# Patient Record
Sex: Female | Born: 1970 | Race: White | Hispanic: No | Smoking: Never smoker
Health system: Southern US, Community
[De-identification: ages and names within clinical notes are randomized; demographics above are authoritative.]

## PROBLEM LIST (undated history)

## (undated) DIAGNOSIS — I10 Essential (primary) hypertension: Secondary | ICD-10-CM

## (undated) DIAGNOSIS — E669 Obesity, unspecified: Secondary | ICD-10-CM

## (undated) HISTORY — PX: CARPAL TUNNEL RELEASE: SHX101

---

## 2014-04-01 ENCOUNTER — Emergency Department (HOSPITAL_COMMUNITY): Payer: Medicaid - Out of State

## 2014-04-01 ENCOUNTER — Emergency Department (HOSPITAL_COMMUNITY)
Admission: EM | Admit: 2014-04-01 | Discharge: 2014-04-01 | Disposition: A | Discharge status: Home / Self Care | Payer: Medicaid - Out of State | Attending: Emergency Medicine | Admitting: Emergency Medicine

## 2014-04-01 DIAGNOSIS — L0211 Cutaneous abscess of neck: Secondary | ICD-10-CM | POA: Diagnosis not present

## 2014-04-01 DIAGNOSIS — L0390 Cellulitis, unspecified: Secondary | ICD-10-CM

## 2014-04-01 DIAGNOSIS — L089 Local infection of the skin and subcutaneous tissue, unspecified: Secondary | ICD-10-CM | POA: Insufficient documentation

## 2014-04-01 DIAGNOSIS — L03221 Cellulitis of neck: Secondary | ICD-10-CM | POA: Diagnosis not present

## 2014-04-01 DIAGNOSIS — L02219 Cutaneous abscess of trunk, unspecified: Secondary | ICD-10-CM | POA: Diagnosis not present

## 2014-04-01 DIAGNOSIS — L03319 Cellulitis of trunk, unspecified: Secondary | ICD-10-CM

## 2014-04-01 DIAGNOSIS — L0291 Cutaneous abscess, unspecified: Secondary | ICD-10-CM

## 2014-04-01 LAB — BASIC METABOLIC PANEL
Anion gap: 11 (ref 5–15)
BUN: 12 mg/dL (ref 6–23)
CO2: 25 meq/L (ref 19–32)
Calcium: 8.8 mg/dL (ref 8.4–10.5)
Chloride: 101 mEq/L (ref 96–112)
Creatinine, Ser: 0.75 mg/dL (ref 0.50–1.10)
GFR calc Af Amer: >90 mL/min (ref >90)
GLUCOSE: 109 mg/dL — AB (ref 70–99)
Potassium: 3.8 mEq/L (ref 3.7–5.3)
SODIUM: 137 meq/L (ref 137–147)

## 2014-04-01 LAB — CBC WITH DIFFERENTIAL/PLATELET
Basophils Absolute: 0 10*3/uL (ref 0.0–0.1)
Basophils Relative: 0 % (ref 0–1)
EOS ABS: 0.1 10*3/uL (ref 0.0–0.7)
EOS PCT: 1 % (ref 0–5)
HCT: 39.8 % (ref 36.0–46.0)
HEMOGLOBIN: 13.3 g/dL (ref 12.0–15.0)
LYMPHS ABS: 1.8 10*3/uL (ref 0.7–4.0)
LYMPHS PCT: 16 % (ref 12–46)
MCH: 27 pg (ref 26.0–34.0)
MCHC: 33.4 g/dL (ref 30.0–36.0)
MCV: 80.9 fL (ref 78.0–100.0)
MONOS PCT: 6 % (ref 3–12)
Monocytes Absolute: 0.7 10*3/uL (ref 0.1–1.0)
Neutro Abs: 8.7 10*3/uL — ABNORMAL HIGH (ref 1.7–7.7)
Neutrophils Relative %: 77 % (ref 43–77)
PLATELETS: 285 10*3/uL (ref 150–400)
RBC: 4.92 MIL/uL (ref 3.87–5.11)
RDW: 13 % (ref 11.5–15.5)
WBC: 11.3 10*3/uL — ABNORMAL HIGH (ref 4.0–10.5)

## 2014-04-01 LAB — I-STAT CG4 LACTIC ACID, ED: LACTIC ACID, VENOUS: 1.19 mmol/L (ref 0.5–2.2)

## 2014-04-01 MED ORDER — OXYCODONE-ACETAMINOPHEN 5-325 MG PO TABS: 1.0000 | ORAL_TABLET | ORAL | Status: DC | PRN

## 2014-04-01 MED ORDER — CLINDAMYCIN PHOSPHATE 600 MG/50ML IV SOLN
600.0000 mg | Freq: Once | INTRAVENOUS | Status: AC
  Administered 2014-04-01: 600 mg via INTRAVENOUS
  Filled 2014-04-01: qty 50

## 2014-04-01 MED ORDER — KETOROLAC TROMETHAMINE 30 MG/ML IJ SOLN
30.0000 mg | Freq: Once | INTRAMUSCULAR | Status: AC
  Administered 2014-04-01: 30 mg via INTRAVENOUS
  Filled 2014-04-01: qty 1

## 2014-04-01 MED ORDER — SULFAMETHOXAZOLE-TRIMETHOPRIM 800-160 MG PO TABS: 1.0000 | ORAL_TABLET | Freq: Two times a day (BID) | ORAL | Status: AC

## 2014-04-01 MED ORDER — POVIDONE-IODINE 10 % EX SOLN
CUTANEOUS | Status: AC
  Administered 2014-04-01: 11:00:00
  Filled 2014-04-01: qty 118

## 2014-04-01 MED ORDER — IOHEXOL 300 MG/ML  SOLN
75.0000 mL | Freq: Once | INTRAMUSCULAR | Status: AC | PRN
  Administered 2014-04-01: 75 mL via INTRAVENOUS

## 2014-04-01 MED ORDER — LIDOCAINE HCL (PF) 1 % IJ SOLN
INTRAMUSCULAR | Status: AC
  Administered 2014-04-01: 11:00:00
  Filled 2014-04-01: qty 5

## 2014-04-01 MED ORDER — IBUPROFEN 800 MG PO TABS: 800.0000 mg | ORAL_TABLET | Freq: Three times a day (TID) | ORAL | Status: DC | PRN

## 2014-04-01 MED ORDER — SODIUM CHLORIDE 0.9 % IV BOLUS (SEPSIS)
1000.0000 mL | Freq: Once | INTRAVENOUS | Status: AC
  Administered 2014-04-01: 1000 mL via INTRAVENOUS

## 2014-04-01 MED ORDER — MORPHINE SULFATE 4 MG/ML IJ SOLN
4.0000 mg | Freq: Once | INTRAMUSCULAR | Status: AC
  Administered 2014-04-01: 4 mg via INTRAVENOUS
  Filled 2014-04-01: qty 1

## 2014-04-01 NOTE — ED Notes (Signed)
Pt. States the boil started to appear about three days ago.

## 2014-04-01 NOTE — Discharge Instructions (Signed)
Abscess °Care After °An abscess (also called a boil or furuncle) is an infected area that contains a collection of pus. Signs and symptoms of an abscess include pain, tenderness, redness, or hardness, or you may feel a moveable soft area under your skin. An abscess can occur anywhere in the body. The infection may spread to surrounding tissues causing cellulitis. A cut (incision) by the surgeon was made over your abscess and the pus was drained out. Gauze may have been packed into the space to provide a drain that will allow the cavity to heal from the inside outwards. The boil may be painful for 5 to 7 days. Most people with a boil do not have high fevers. Your abscess, if seen early, may not have localized, and may not have been lanced. If not, another appointment may be required for this if it does not get better on its own or with medications. °HOME CARE INSTRUCTIONS  °· Only take over-the-counter or prescription medicines for pain, discomfort, or fever as directed by your caregiver. °· When you bathe, soak and then remove gauze or iodoform packs at least daily or as directed by your caregiver. You may then wash the wound gently with mild soapy water. Repack with gauze or do as your caregiver directs. °SEEK IMMEDIATE MEDICAL CARE IF:  °· You develop increased pain, swelling, redness, drainage, or bleeding in the wound site. °· You develop signs of generalized infection including muscle aches, chills, fever, or a general ill feeling. °· An oral temperature above 102° F (38.9° C) develops, not controlled by medication. °See your caregiver for a recheck if you develop any of the symptoms described above. If medications (antibiotics) were prescribed, take them as directed. °Document Released: 01/11/2005 Document Revised: 09/17/2011 Document Reviewed: 09/08/2007 °ExitCare® Patient Information ©2015 [employer]. This information is not intended to replace advice given to you by your health care provider. Make sure  you discuss any questions you have with your health care provider. ° °Cellulitis °Cellulitis is an infection of the skin and the tissue beneath it. The infected area is usually red and tender. Cellulitis occurs most often in the arms and lower legs.  °CAUSES  °Cellulitis is caused by bacteria that enter the skin through cracks or cuts in the skin. The most common types of bacteria that cause cellulitis are staphylococci and streptococci. °SIGNS AND SYMPTOMS  °· Redness and warmth. °· Swelling. °· Tenderness or pain. °· Fever. °DIAGNOSIS  °Your health care provider can usually determine what is wrong based on a physical exam. Blood tests may also be done. °TREATMENT  °Treatment usually involves taking an antibiotic medicine. °HOME CARE INSTRUCTIONS  °· Take your antibiotic medicine as directed by your health care provider. Finish the antibiotic even if you start to feel better. °· Keep the infected arm or leg elevated to reduce swelling. °· Apply a warm cloth to the affected area up to 4 times per day to relieve pain. °· Take medicines only as directed by your health care provider. °· Keep all follow-up visits as directed by your health care provider. °SEEK MEDICAL CARE IF:  °· You notice red streaks coming from the infected area. °· Your red area gets larger or turns dark in color. °· Your bone or joint underneath the infected area becomes painful after the skin has healed. °· Your infection returns in the same area or another area. °· You notice a swollen bump in the infected area. °· You develop new symptoms. °· You have   a fever. °SEEK IMMEDIATE MEDICAL CARE IF:  °· You feel very sleepy. °· You develop vomiting or diarrhea. °· You have a general ill feeling (malaise) with muscle aches and pains. °MAKE SURE YOU:  °· Understand these instructions. °· Will watch your condition. °· Will get help right away if you are not doing well or get worse. °Document Released: 04/04/2005 Document Revised: 11/09/2013 Document  Reviewed: 09/10/2011 °ExitCare® Patient Information ©2015 [employer]. This information is not intended to replace advice given to you by your health care provider. Make sure you discuss any questions you have with your health care provider. ° °

## 2014-04-01 NOTE — ED Provider Notes (Signed)
This chart was scribed for Jacqueline Maw Loralie Malta, DO by Jacqueline Woods, ED Scribe. This patient was seen in room APA14/APA14 and the patient's care was started at 9:38 AM.   TIME SEEN: 9:38 AM  CHIEF COMPLAINT: Abscess  HPI: HPI Comments: Jacqueline Woods is a 43 y.o. female with history of morbid obesity who presents to the Emergency Department complaining of a painful, groin abscess to her right neck below her chin for the past several days. It progressively getting worse. She states she's felt some tightness in her throat. No chest pain. Pt has no history of abscesses. Pt denies fever, chills, drainage from the abscess, trouble breathing, difficulty swallowing, , changes in her voice, nausea, vomiting, or diarrhea as associated symptoms. Pt has no history of DM.    ROS: See HPI Constitutional: no fever, no chills Eyes: no drainage  ENT: no runny nose, no trouble breathing, no trouble swallowing Cardiovascular:  no chest pain  Resp: no SOB  GI: no vomiting, no diarrhea, no nausea GU: no dysuria Integumentary: no rash  Allergy: no hives  Musculoskeletal: no leg swelling  Neurological: no slurred speech ROS otherwise negative  PAST MEDICAL HISTORY/PAST SURGICAL HISTORY:  No past medical history on file.  MEDICATIONS:  Prior to Admission medications   Not on File    ALLERGIES:  Allergies not on file  SOCIAL HISTORY:  History  Substance Use Topics  . Smoking status: Not on file  . Smokeless tobacco: Not on file  . Alcohol Use: Not on file    FAMILY HISTORY: No family history on file.  EXAM: BP 178/114  Pulse 88  Temp(Src) 97.7 F (36.5 C) (Oral)  Resp 20  Ht  (1.651 m)  Wt 300 lb (136.079 kg)  BMI 49.92 kg/m2  SpO2 100%  LMP 03/17/2014 CONSTITUTIONAL: Alert and oriented and responds appropriately to questions. Well-appearing; well-nourished HEAD: Normocephalic EYES: Conjunctivae clear, PERRL ENT: normal nose; no rhinorrhea; moist mucous membranes; pharynx without  lesions noted; tongue sits flat on floor of mouth without elevation; no trismus, no drooling, no stridor, and no swelling of tongue NECK: Supple, no meningismus, no LAD; 2x3 cm fluctuant area with central purulent head under the chin on the right anterior neck with 6-8 cm area of induration, warmth, erythema; no sign of Ludwig's  CARD: RRR; S1 and S2 appreciated; no murmurs, no clicks, no rubs, no gallops RESP: Normal chest excursion without splinting or tachypnea; breath sounds clear and equal bilaterally; no wheezes, no rhonchi, no rales, no respiratory distress or hypoxia, no increased work of breathing ABD/GI: Normal bowel sounds; non-distended; soft, non-tender, no rebound, no guarding BACK:  The back appears normal and is non-tender to palpation, there is no CVA tenderness EXT: Normal ROM in all joints; non-tender to palpation; no edema; normal capillary refill; no cyanosis    SKIN: Normal color for age and race; warm NEURO: Moves all extremities equally PSYCH: The patient's mood and manner are appropriate. Grooming and personal hygiene are appropriate.  MEDICAL DECISION MAKING: Patient here with an abscess to her anterior neck below her chin. I was able to incise and drain the abscess but given the amount of cellulitis in this area and the patient is morbidly obese, I will obtain a CT of her soft tissues of her neck to see if there is further extension. Abscess appears to be very superficial. She has no difficulty swallowing, breathing, speaking. Voice is normal. No stridor. Airway is patent. We'll give IV fluids, pain medication, antibiotics. We'll  obtain labs, cultures.   ED PROGRESS: Labs show leukocytosis of 11.3 with left shift. Lactate normal. CT scan shows an area of focal skin thickening to the right of midline just below the mandible without any further extension of abscess, there is inflammation consistent with cellulitis and reactive lymph nodes. No deep space neck infection. No  evidence of osteomyelitis. Patient's symptoms have are improved including her swelling, pain and erythema. I feel she is safe to be discharged home and have discussed strict return precautions and that if this worsens or she develops airway involvement, changes in her voice or difficulty swallowing her secretions she should return to the hospital immediately.     INCISION AND DRAINAGE Performed by: Jacqueline Woods Consent: Verbal consent obtained. Risks and benefits: risks, benefits and alternatives were discussed Type: abscess  Body area: Right anterior neck  Anesthesia: local infiltration  Incision was made with a scalpel.  Local anesthetic: lidocaine 1 % without epinephrine  Anesthetic total: 5 ml  Complexity: complex Blunt dissection to break up loculations  Drainage: purulent  Drainage amount: Small   Patient tolerance: Patient tolerated the procedure well with no immediate complications.       I personally performed the services described in this documentation, which was scribed in my presence. The recorded information has been reviewed and is accurate.   Jacqueline Maw Shyrl Obi, DO 04/01/14 1501

## 2014-04-02 ENCOUNTER — Telehealth (HOSPITAL_BASED_OUTPATIENT_CLINIC_OR_DEPARTMENT_OTHER): Payer: Self-pay | Admitting: Emergency Medicine

## 2014-04-02 NOTE — Telephone Encounter (Signed)
Positive blood culture - gram + cocci in clusters, aerobic bottle Dr Fayrene Fearing notified, orders given to call patient, if fevers or worsening symptoms to return to ED ASAP; if no fevers or worsening symptoms to continue prescribed antibiotic and follow-up with PCP  04/02/14 @ 1620:  Left message with patient's daughter to return call to [phone] asap

## 2014-04-04 ENCOUNTER — Encounter (HOSPITAL_COMMUNITY): Payer: Self-pay | Admitting: Emergency Medicine

## 2014-04-04 ENCOUNTER — Telehealth (HOSPITAL_BASED_OUTPATIENT_CLINIC_OR_DEPARTMENT_OTHER): Payer: Self-pay | Admitting: Emergency Medicine

## 2014-04-04 ENCOUNTER — Emergency Department (HOSPITAL_COMMUNITY)
Admission: EM | Admit: 2014-04-04 | Discharge: 2014-04-04 | Disposition: A | Discharge status: Home / Self Care | Payer: Medicaid - Out of State | Attending: Emergency Medicine | Admitting: Emergency Medicine

## 2014-04-04 DIAGNOSIS — L0201 Cutaneous abscess of face: Secondary | ICD-10-CM | POA: Diagnosis present

## 2014-04-04 DIAGNOSIS — E669 Obesity, unspecified: Secondary | ICD-10-CM | POA: Insufficient documentation

## 2014-04-04 DIAGNOSIS — Z9889 Other specified postprocedural states: Secondary | ICD-10-CM | POA: Insufficient documentation

## 2014-04-04 DIAGNOSIS — Z8679 Personal history of other diseases of the circulatory system: Secondary | ICD-10-CM | POA: Diagnosis not present

## 2014-04-04 DIAGNOSIS — Z791 Long term (current) use of non-steroidal anti-inflammatories (NSAID): Secondary | ICD-10-CM | POA: Insufficient documentation

## 2014-04-04 DIAGNOSIS — L03211 Cellulitis of face: Principal | ICD-10-CM

## 2014-04-04 HISTORY — DX: Obesity, unspecified: E66.9

## 2014-04-04 LAB — CULTURE, BLOOD (ROUTINE X 2)

## 2014-04-04 MED ORDER — OXYCODONE-ACETAMINOPHEN 5-325 MG PO TABS: 2.0000 | ORAL_TABLET | ORAL | Status: DC | PRN

## 2014-04-04 MED ORDER — SULFAMETHOXAZOLE-TRIMETHOPRIM 800-160 MG PO TABS: 1.0000 | ORAL_TABLET | Freq: Two times a day (BID) | ORAL | Status: DC

## 2014-04-04 NOTE — ED Notes (Signed)
Wound dressing changed.

## 2014-04-04 NOTE — Telephone Encounter (Signed)
Post ED Visit - Positive Culture Follow-up: Successful Patient Follow-Up  Culture assessed and recommendations reviewed by:  Wes Dulaney, Pharm.D., BCPS  Celedonio Miyamoto, [address].D., BCPS  Georgina Pillion, [address].D., BCPS  [place], [address].D., BCPS, AAHIVP  Estella Husk, Pharm.D., BCPS, AAHIVP  Red Christians, Pharm.D.  Carly Sabat, Pharm.D.  Positive Blood culture    Patient discharged without antimicrobial prescription and treatment is now indicated  Organism is resistant to prescribed ED discharge antimicrobial  Patient with positive blood cultures  Have patient come back in ([facility]) for staph bacteremia workup  Contacted patient, date 04/03/14, time 1146 Positive Blood culture +MRSA. Patient notified, instructed to return to ED recheck today. Patient verbalized understanding.  Jiles Harold 04/04/2014, 12:03 PM

## 2014-04-04 NOTE — ED Notes (Signed)
Pt was here on Thursday and had abscess on neck lanced. Pt was call this morning and told to come to the ED for lab test. Pt was told she has infection in blood.

## 2014-04-04 NOTE — ED Provider Notes (Signed)
CSN: [id number]     Arrival date & time 04/04/14  1621 History   This chart was scribed for Donnetta Hutching, MD, by Yevette Edwards, ED Scribe. This patient was seen in room APA11/APA11 and the patient's care was started at 5:07 PM.  First MD Initiated Contact with Patient 04/04/14 1658     Chief Complaint  Patient presents with  . Wound Infection    The history is provided by the patient. No language interpreter was used.   HPI Comments:  Jacqueline Woods is a 43 y.o. female who presents to the Emergency Department complaining of an infected area to her chin which she first noticed five days ago and which has been associated with redness and pain. The site was treated in the ED three days ago on 04/01/14; the site was incised and drained and she was prescribed Bactrim. The pt reports she was informed to return to the ED today due to the results of the culture; the culture is positive for MRSA. The site continues to drain. She denies nausea, emesis, chills, or a fever; in the ED her temperature is 98.6 F.    The pt also expresses concern over her BP; she has a h/o gestational hypertension and she has had elevated BP in the past; she does not currently take medication for HTN.  She does not have a PCP.   Past Medical History  Diagnosis Date  . Obesity    Past Surgical History  Procedure Laterality Date  . Carpal tunnel release      right hand  . Cesarean section     No family history on file. History  Substance Use Topics  . Smoking status: Never Smoker   . Smokeless tobacco: Not on file  . Alcohol Use: No   No OB history provided.  Review of Systems  A complete 10 system review of systems was obtained, and all systems were negative except where indicated in the HPI and PE.    Allergies  Review of patient's allergies indicates no known allergies.  Home Medications   Prior to Admission medications   Medication Sig Start Date End Date Taking? Authorizing Provider  ibuprofen  (ADVIL,MOTRIN) 800 MG tablet Take 800 mg by mouth every 8 (eight) hours as needed for mild pain. 04/01/14  Yes Kristen N Ward, DO  oxyCODONE-acetaminophen (PERCOCET/ROXICET) 5-325 MG per tablet Take 1 tablet by mouth every 4 (four) hours as needed. 04/01/14  Yes Kristen N Ward, DO  sulfamethoxazole-trimethoprim (BACTRIM DS,SEPTRA DS) 800-160 MG per tablet Take 1 tablet by mouth 2 (two) times daily. 04/01/14 04/08/14 Yes Kristen N Ward, DO  oxyCODONE-acetaminophen (PERCOCET) 5-325 MG per tablet Take 2 tablets by mouth every 4 (four) hours as needed. 04/04/14   Donnetta Hutching, MD  sulfamethoxazole-trimethoprim (SEPTRA DS) 800-160 MG per tablet Take 1 tablet by mouth 2 (two) times daily. 04/04/14   Donnetta Hutching, MD   Triage Vitals: BP 202/111  Pulse 76  Temp(Src) 98.6 F (37 C) (Oral)  Resp 18  Ht  (1.651 m)  Wt 300 lb (136.079 kg)  BMI 49.92 kg/m2  SpO2 100%  LMP 03/17/2014  Physical Exam  Nursing note and vitals reviewed. Constitutional: She is oriented to person, place, and time. She appears well-developed and well-nourished.  Obese.  HENT:  Head: Normocephalic and atraumatic.  Eyes: Conjunctivae and EOM are normal. Pupils are equal, round, and reactive to light.  Neck: Normal range of motion. Neck supple.  Cardiovascular: Normal rate, regular rhythm and normal  heart sounds.   Pulmonary/Chest: Effort normal and breath sounds normal.  Abdominal: Soft. Bowel sounds are normal.  Musculoskeletal: Normal range of motion.  Neurological: She is alert and oriented to person, place, and time.  Skin: Skin is warm and dry.  2.5 cm area of induration to the right chin with a central core that is draining purulent drainage.   Psychiatric: She has a normal mood and affect. Her behavior is normal.    ED Course  Procedures (including critical care time)  DIAGNOSTIC STUDIES: Oxygen Saturation is 100% on room air, normal by my interpretation.    COORDINATION OF CARE:  5:21 PM- Discussed treatment  plan with patient, and the patient agreed to the plan which includes a longer course of antibiotics. Encouraged the pt to use warm compresses regularly throughout the day. Also looked at a site to the pt's son's right ear. The pt requested additional pain medication; will provide.   Labs Review Labs Reviewed - No data to display  Imaging Review No results found.   EKG Interpretation None      MDM   Final diagnoses:  Abscess of chin    Patient was apparently called to return to emergency department for positive blood culture. Blood culture reviewed and positive for MRSA. Sensitive to Bactrim.  Patient is exhibiting no evidence of sepsis. Specifically, no fever, no chills.  She is nontoxic appearing. Wound is draining purulent material. I will continue her Bactrim for a total of 12 days.  She understands to return if worse.  Rx Percocet  I personally performed the services described in this documentation, which was scribed in my presence. The recorded information has been reviewed and is accurate.     Donnetta Hutching, MD 04/04/14 [phone]

## 2014-04-04 NOTE — Discharge Instructions (Signed)
Abscess An abscess (boil or furuncle) is an infected area on or under the skin. This area is filled with yellowish-white fluid (pus) and other material (debris). HOME CARE   Only take medicines as told by your doctor.  If you were given antibiotic medicine, take it as directed. Finish the medicine even if you start to feel better.  If gauze is used, follow your doctor's directions for changing the gauze.  To avoid spreading the infection:  Keep your abscess covered with a bandage.  Wash your hands well.  Do not share personal care items, towels, or whirlpools with others.  Avoid skin contact with others.  Keep your skin and clothes clean around the abscess.  Keep all doctor visits as told. GET HELP RIGHT AWAY IF:   You have more pain, puffiness (swelling), or redness in the wound site.  You have more fluid or blood coming from the wound site.  You have muscle aches, chills, or you feel sick.  You have a fever. MAKE SURE YOU:   Understand these instructions.  Will watch your condition.  Will get help right away if you are not doing well or get worse. Document Released: 12/12/2007 Document Revised: 12/25/2011 Document Reviewed: 09/07/2011 [facility] Patient Information 2015 [place], [place]. This information is not intended to replace advice given to you by your health care provider. Make sure you discuss any questions you have with your health care provider.   You can bathe in hot soapy water.   Apply moist hot wash cloth to chin.   Extend antibiotic for 5 more days. Prescription given. Pain meds.  If the wound drains, this is a good thing

## 2014-04-05 NOTE — Progress Notes (Signed)
ED Antimicrobial Stewardship Positive Culture Follow Up   Jacqueline Woods is an 43 y.o. female who presented to [facility] on 04/04/2014 with a chief complaint of  Chief Complaint  Patient presents with  . Wound Infection    Recent Results (from the past 720 hour(s))  CULTURE, BLOOD (ROUTINE X 2)     Status: None   Collection Time    04/01/14 10:22 AM      Result Value Ref Range Status   Specimen Description BLOOD RIGHT ANTECUBITAL DRAWN BY RN DMA   Final   Special Requests BOTTLES DRAWN AEROBIC AND ANAEROBIC [facility]   Final   Culture  Setup Time     Final   Value: 04/03/2014 00:17     Performed at [facility]   Culture     Final   Value: METHICILLIN RESISTANT STAPHYLOCOCCUS AUREUS     Note: RIFAMPIN AND GENTAMICIN SHOULD NOT BE USED AS SINGLE DRUGS FOR TREATMENT OF STAPH INFECTIONS. CRITICAL RESULT CALLED TO, READ BACK BY AND VERIFIED WITH: SHANNON GAMMONS 04/04/14 @ 9:50AM BY RUSCOE A.     Note: Gram Stain Report Called to,Read Back By and Verified With: S. GAMMONS AT 1509 ON 04/02/14 BY Wynonia Lawman Performed at [facility]     Performed at [facility]   Report Status 04/04/2014 FINAL   Final   Organism ID, Bacteria METHICILLIN RESISTANT STAPHYLOCOCCUS AUREUS   Final  CULTURE, BLOOD (ROUTINE X 2)     Status: None   Collection Time    04/01/14 10:28 AM      Result Value Ref Range Status   Specimen Description BLOOD LEFT HAND   Final   Special Requests     Final   Value: BOTTLES DRAWN AEROBIC AND ANAEROBIC AEB=6CC ANA=4CC   Culture NO GROWTH 4 DAYS   Final   Report Status PENDING   Incomplete     Treated with Bactrim  Pt came back into ED for positive 1/2 blood cultures with MRSA. Bactrim therapy extended to 12 days of treatment.   Discussed patient with Dr. Ninetta Lights from ID and Langston Masker in the ED and have decided that pt needs further work up requiring admission to the hospital because of the bacteremia. Oral Bactrim is most likely not good enough for  bacteremia and the dose she is on is probably not enough anyways. Flow manager will contact patient to let them know the plan.  ED Provider: Langston Masker PA-C   Armandina Stammer 04/05/2014, 12:31 PM Infectious Diseases Pharmacist Phone# [phone]

## 2014-04-05 NOTE — Telephone Encounter (Signed)
Post ED Visit - Positive Culture Follow-up: Successful Patient Follow-Up  Culture assessed and recommendations reviewed by:  Wes Dulaney, Pharm.D., BCPS  Celedonio Miyamoto, Pharm.D., BCPS  Georgina Pillion, [address]D., BCPS  [place], [address].D., BCPS, AAHIVP  Estella Husk, Pharm.D., BCPS, AAHIVP  Red Christians, Pharm.D.  Tennis Must, [place].D.  Positive blood culture 1/2 MRSA   Patient discharged without antimicrobial prescription and treatment is now indicated  Organism is resistant to prescribed ED discharge antimicrobial  Patient with positive blood cultures  Changes discussed with ED provider: Dr, Johny Sax, Infectious diseases New antibiotic prescription   Needs 2 week course of IV Vanc as an outpatient    Contacted patient, date 04/05/14 , time 1350, advised pt, she states not able to return today, will try to return tomorrow 04/06/14   Berle Mull 04/05/2014, 1:54 PM

## 2014-04-06 ENCOUNTER — Telehealth: Payer: Self-pay | Admitting: Infectious Diseases

## 2014-04-06 LAB — CULTURE, BLOOD (ROUTINE X 2): Culture: NO GROWTH

## 2014-04-06 NOTE — Telephone Encounter (Signed)
Called pt and left message that she needs to return to the hospital for IV anbx for her bacteremia. Asked her to return to the ED and have the ED MD call me if questions.

## 2014-04-08 ENCOUNTER — Emergency Department (HOSPITAL_COMMUNITY)
Admission: EM | Admit: 2014-04-08 | Discharge: 2014-04-08 | Discharge status: Against Medical Advice | Payer: Medicaid - Out of State | Attending: Emergency Medicine | Admitting: Emergency Medicine

## 2014-04-08 ENCOUNTER — Encounter (HOSPITAL_COMMUNITY): Payer: Self-pay | Admitting: Emergency Medicine

## 2014-04-08 DIAGNOSIS — Z452 Encounter for adjustment and management of vascular access device: Secondary | ICD-10-CM | POA: Insufficient documentation

## 2014-04-08 DIAGNOSIS — E669 Obesity, unspecified: Secondary | ICD-10-CM | POA: Insufficient documentation

## 2014-04-08 NOTE — ED Notes (Signed)
Pt reports received phone call from Dr. Moshe CiproHatcher's office to come to ED to have PICC Line placed. Pt has Staph infection.

## 2014-04-08 NOTE — ED Notes (Signed)
Pt informed that she will be unable to have PICC line inserted tonight because our PICC line nurse has left for the day.   RN spoke with Dr.Knapp and reviewed chart. Pt instructed that she could be assessed by EDP, have peripheral IV inserted and possibly have antibiotic therapy started tonight and we could page Dr. Ninetta LightsHatcher and possibly set up pt for Picc line placement tomorrow.   Pt states she does not want to stay be seen in ED tonight and will call Dr. Ninetta LightsHatcher in am.  Pt advised to be seen by EDP d/t risks associated with positive blood cultures. Pt refused to stay. Pt teaching done on signs/sx of sepsis and instructed to return for new/worsening sx. nad noted.

## 2014-04-09 NOTE — Telephone Encounter (Signed)
Patient went to the ED at [facility] on 10/1 based on the advice given to her from this office, she states that they did not know what she was talking about and did not contact Dr. Ninetta LightsHatcher on his pager. Per Dr. Ninetta LightsHatcher patient is instructed to finish all her antibiotics which should be tomorrow and she will be in our office for blood cultures this week on 10/7 or 10/8. Patient is ok with this plan.

## 2014-04-12 ENCOUNTER — Telehealth: Payer: Self-pay | Admitting: *Deleted

## 2014-04-12 NOTE — Telephone Encounter (Signed)
Patient called, asking if she really needed to come for labwork, as she doesn't have insurance or any way to pay for her visit.  Patient advised we would bill her, [facility].  Patient agreed. Patient asked if we could provide a return-to-work note for her.  She states that her employer won't let her go back to work without a release, and she is about to have her electricity cut off.  RN advised her to return to [facility] ED for this, as we have not actually seen the patient yet, and she will not be seen by a physician tomorrow afternoon.  Pt agreed. Andree CossHowell, Alizia Greif M, RN

## 2014-04-13 ENCOUNTER — Other Ambulatory Visit: Payer: Self-pay | Admitting: Infectious Diseases

## 2014-04-13 ENCOUNTER — Other Ambulatory Visit (INDEPENDENT_AMBULATORY_CARE_PROVIDER_SITE_OTHER): Payer: Medicaid - Out of State

## 2014-04-13 DIAGNOSIS — R7881 Bacteremia: Secondary | ICD-10-CM

## 2014-04-13 DIAGNOSIS — B9562 Methicillin resistant Staphylococcus aureus infection as the cause of diseases classified elsewhere: Secondary | ICD-10-CM

## 2014-04-14 NOTE — ED Notes (Signed)
Patient requests note to return to work, ok to give note per Dr Adriana Simasook

## 2014-04-19 LAB — CULTURE, BLOOD (SINGLE)
ORGANISM ID, BACTERIA: NO GROWTH
Organism ID, Bacteria: NO GROWTH

## 2014-12-24 ENCOUNTER — Encounter (HOSPITAL_COMMUNITY): Payer: Self-pay | Admitting: *Deleted

## 2014-12-24 ENCOUNTER — Emergency Department (HOSPITAL_COMMUNITY)
Admission: EM | Admit: 2014-12-24 | Discharge: 2014-12-24 | Disposition: A | Discharge status: Home / Self Care | Payer: Medicaid - Out of State | Attending: Emergency Medicine | Admitting: Emergency Medicine

## 2014-12-24 DIAGNOSIS — E669 Obesity, unspecified: Secondary | ICD-10-CM | POA: Insufficient documentation

## 2014-12-24 DIAGNOSIS — I10 Essential (primary) hypertension: Secondary | ICD-10-CM | POA: Insufficient documentation

## 2014-12-24 DIAGNOSIS — M5432 Sciatica, left side: Secondary | ICD-10-CM | POA: Insufficient documentation

## 2014-12-24 HISTORY — DX: Essential (primary) hypertension: I10

## 2014-12-24 MED ORDER — CYCLOBENZAPRINE HCL 10 MG PO TABS: 10.0000 mg | ORAL_TABLET | Freq: Three times a day (TID) | ORAL | Status: AC | PRN

## 2014-12-24 MED ORDER — LISINOPRIL-HYDROCHLOROTHIAZIDE 20-12.5 MG PO TABS: 1.0000 | ORAL_TABLET | Freq: Every day | ORAL | Status: AC

## 2014-12-24 MED ORDER — HYDROMORPHONE HCL 2 MG/ML IJ SOLN
2.0000 mg | Freq: Once | INTRAMUSCULAR | Status: AC
  Administered 2014-12-24: 2 mg via INTRAMUSCULAR
  Filled 2014-12-24: qty 1

## 2014-12-24 MED ORDER — HYDROCODONE-ACETAMINOPHEN 5-325 MG PO TABS: 2.0000 | ORAL_TABLET | ORAL | Status: AC | PRN

## 2014-12-24 MED ORDER — DEXAMETHASONE SODIUM PHOSPHATE 10 MG/ML IJ SOLN
10.0000 mg | Freq: Once | INTRAMUSCULAR | Status: AC
  Administered 2014-12-24: 10 mg via INTRAMUSCULAR
  Filled 2014-12-24: qty 1

## 2014-12-24 MED ORDER — ONDANSETRON 8 MG PO TBDP
8.0000 mg | ORAL_TABLET | Freq: Once | ORAL | Status: AC
  Administered 2014-12-24: 8 mg via ORAL
  Filled 2014-12-24: qty 1

## 2014-12-24 MED ORDER — PREDNISONE 20 MG PO TABS: ORAL_TABLET | ORAL | Status: AC

## 2014-12-24 NOTE — ED Notes (Addendum)
Pt c/o pain from her left buttocks down to her foot x 3 days. Pt states the pain keeps her awake at night. Pt has been out of her bp meds x 2 months.

## 2014-12-24 NOTE — ED Provider Notes (Signed)
CSN: [id number]     Arrival date & time 12/24/14  2037 History   First MD Initiated Contact with Patient 12/24/14 2053     Chief Complaint  Patient presents with  . Leg Pain     (Consider location/radiation/quality/duration/timing/severity/associated sxs/prior Treatment) HPI Comments: Patient presents to the ER for evaluation of low back pain. Patient reports that she has been having pain in the left buttock region that radiates down her leg for 3 days. Patient reports that the pain has progressively worsened. She denies injury. Pain is sharp, stabbing and severe. It has kept her up at night. She has not had any numbness, tingling or weakness of lower extremities. No change in bowel or bladder function.  Patient noted to be very hypertensive at arrival. She reports a history of hypertension. She has been out of her medications for 2 months. No headache, blurred vision, chest pain, shortness of breath.  Patient is a 44 y.o. female presenting with leg pain.  Leg Pain Associated symptoms: back pain     Past Medical History  Diagnosis Date  . Obesity   . Hypertension    Past Surgical History  Procedure Laterality Date  . Carpal tunnel release      right hand  . Cesarean section     History reviewed. No pertinent family history. History  Substance Use Topics  . Smoking status: Never Smoker   . Smokeless tobacco: Not on file  . Alcohol Use: No   OB History    No data available     Review of Systems  Musculoskeletal: Positive for back pain.  All other systems reviewed and are negative.     Allergies  Review of patient's allergies indicates no known allergies.  Home Medications   Prior to Admission medications   Medication Sig Start Date End Date Taking? Authorizing Provider  acetaminophen (TYLENOL) 500 MG tablet Take 500 mg by mouth every 6 (six) hours as needed for mild pain or moderate pain.   Yes Historical Provider, MD   BP 198/115 mmHg  Pulse 74  Resp 20  Ht   (1.626 m)  Wt 320 lb (145.151 kg)  BMI 54.90 kg/m2  SpO2 100% Physical Exam  Constitutional: She is oriented to person, place, and time. She appears well-developed and well-nourished. No distress.  HENT:  Head: Normocephalic and atraumatic.  Right Ear: Hearing normal.  Left Ear: Hearing normal.  Nose: Nose normal.  Mouth/Throat: Oropharynx is clear and moist and mucous membranes are normal.  Eyes: Conjunctivae and EOM are normal. Pupils are equal, round, and reactive to light.  Neck: Normal range of motion. Neck supple.  Cardiovascular: Regular rhythm, S1 normal and S2 normal.  Exam reveals no gallop and no friction rub.   No murmur heard. Pulmonary/Chest: Effort normal and breath sounds normal. No respiratory distress. She exhibits no tenderness.  Abdominal: Soft. Normal appearance and bowel sounds are normal. There is no hepatosplenomegaly. There is no tenderness. There is no rebound, no guarding, no tenderness at McBurney's point and negative Murphy's sign. No hernia.  Musculoskeletal: Normal range of motion.       Lumbar back: She exhibits tenderness.       Back:  Neurological: She is alert and oriented to person, place, and time. She has normal strength. No cranial nerve deficit or sensory deficit. Coordination normal. GCS eye subscore is 4. GCS verbal subscore is 5. GCS motor subscore is 6.  Normal strength and sensation bilateral lower extremities  No saddle anesthesia  Pain with flexion of left hip  Skin: Skin is warm, dry and intact. No rash noted. No cyanosis.  Psychiatric: She has a normal mood and affect. Her speech is normal and behavior is normal. Thought content normal.  Nursing note and vitals reviewed.   ED Course  Procedures (including critical care time) Labs Review Labs Reviewed - No data to display  Imaging Review No results found.   EKG Interpretation None      MDM   Final diagnoses:  None   sciatica Hypertension  Patient presents to  the ER with musculoskeletal back pain. Examination reveals back tenderness without any associated neurologic findings. Patient's strength, sensation and reflexes were normal. As such, patient did not require any imaging or further studies. Patient was treated with analgesia.  Vision hypertensive here in the ER. She does have a history of essential hypertension and has been off her medications. She also is in significant pain, worsening her blood pressure. She is asymptomatic. Will treat her pain, reinitiate antihypertensives medication.    Gilda Crease, MD 12/24/14 2131

## 2014-12-24 NOTE — Discharge Instructions (Signed)
Sciatica °Sciatica is pain, weakness, numbness, or tingling along the path of the sciatic nerve. The nerve starts in the lower back and runs down the back of each leg. The nerve controls the muscles in the lower leg and in the back of the knee, while also providing sensation to the back of the thigh, lower leg, and the sole of your foot. Sciatica is a symptom of another medical condition. For instance, nerve damage or certain conditions, such as a herniated disk or bone spur on the spine, pinch or put pressure on the sciatic nerve. This causes the pain, weakness, or other sensations normally associated with sciatica. Generally, sciatica only affects one side of the body. °CAUSES  °· Herniated or slipped disc. °· Degenerative disk disease. °· A pain disorder involving the narrow muscle in the buttocks (piriformis syndrome). °· Pelvic injury or fracture. °· Pregnancy. °· Tumor (rare). °SYMPTOMS  °Symptoms can vary from mild to very severe. The symptoms usually travel from the low back to the buttocks and down the back of the leg. Symptoms can include: °· Mild tingling or dull aches in the lower back, leg, or hip. °· Numbness in the back of the calf or sole of the foot. °· Burning sensations in the lower back, leg, or hip. °· Sharp pains in the lower back, leg, or hip. °· Leg weakness. °· Severe back pain inhibiting movement. °These symptoms may get worse with coughing, sneezing, laughing, or prolonged sitting or standing. Also, being overweight may worsen symptoms. °DIAGNOSIS  °Your caregiver will perform a physical exam to look for common symptoms of sciatica. He or she may ask you to do certain movements or activities that would trigger sciatic nerve pain. Other tests may be performed to find the cause of the sciatica. These may include: °· Blood tests. °· X-rays. °· Imaging tests, such as an MRI or CT scan. °TREATMENT  °Treatment is directed at the cause of the sciatic pain. Sometimes, treatment is not necessary  and the pain and discomfort goes away on its own. If treatment is needed, your caregiver may suggest: °· Over-the-counter medicines to relieve pain. °· Prescription medicines, such as anti-inflammatory medicine, muscle relaxants, or narcotics. °· Applying heat or ice to the painful area. °· Steroid injections to lessen pain, irritation, and inflammation around the nerve. °· Reducing activity during periods of pain. °· Exercising and stretching to strengthen your abdomen and improve flexibility of your spine. Your caregiver may suggest losing weight if the extra weight makes the back pain worse. °· Physical therapy. °· Surgery to eliminate what is pressing or pinching the nerve, such as a bone spur or part of a herniated disk. °HOME CARE INSTRUCTIONS  °· Only take over-the-counter or prescription medicines for pain or discomfort as directed by your caregiver. °· Apply ice to the affected area for 20 minutes, 3-4 times a day for the first 48-72 hours. Then try heat in the same way. °· Exercise, stretch, or perform your usual activities if these do not aggravate your pain. °· Attend physical therapy sessions as directed by your caregiver. °· Keep all follow-up appointments as directed by your caregiver. °· Do not wear high heels or shoes that do not provide proper support. °· Check your mattress to see if it is too soft. A firm mattress may lessen your pain and discomfort. °SEEK IMMEDIATE MEDICAL CARE IF:  °· You lose control of your bowel or bladder (incontinence). °· You have increasing weakness in the lower back, pelvis, buttocks,   or legs.  You have redness or swelling of your back.  You have a burning sensation when you urinate.  You have pain that gets worse when you lie down or awakens you at night.  Your pain is worse than you have experienced in the past.  Your pain is lasting longer than 4 weeks.  You are suddenly losing weight without reason. MAKE SURE YOU:  Understand these  instructions.  Will watch your condition.  Will get help right away if you are not doing well or get worse. Document Released: 06/19/2001 Document Revised: 12/25/2011 Document Reviewed: 11/04/2011 [facility] Patient Information 2015 [place], [place]. This information is not intended to replace advice given to you by your health care provider. Make sure you discuss any questions you have with your health care provider.  Hypertension Hypertension, commonly called high blood pressure, is when the force of blood pumping through your arteries is too strong. Your arteries are the blood vessels that carry blood from your heart throughout your body. A blood pressure reading consists of a higher number over a lower number, such as 110/72. The higher number (systolic) is the pressure inside your arteries when your heart pumps. The lower number (diastolic) is the pressure inside your arteries when your heart relaxes. Ideally you want your blood pressure below 120/80. Hypertension forces your heart to work harder to pump blood. Your arteries may become narrow or stiff. Having hypertension puts you at risk for heart disease, stroke, and other problems.  RISK FACTORS Some risk factors for high blood pressure are controllable. Others are not.  Risk factors you cannot control include:   Race. You may be at higher risk if you are African American.  Age. Risk increases with age.  Gender. Men are at higher risk than women before age 70 years. After age 63, women are at higher risk than men. Risk factors you can control include:  Not getting enough exercise or physical activity.  Being overweight.  Getting too much fat, sugar, calories, or salt in your diet.  Drinking too much alcohol. SIGNS AND SYMPTOMS Hypertension does not usually cause signs or symptoms. Extremely high blood pressure (hypertensive crisis) may cause headache, anxiety, shortness of breath, and nosebleed. DIAGNOSIS  To check if you have  hypertension, your health care provider will measure your blood pressure while you are seated, with your arm held at the level of your heart. It should be measured at least twice using the same arm. Certain conditions can cause a difference in blood pressure between your right and left arms. A blood pressure reading that is higher than normal on one occasion does not mean that you need treatment. If one blood pressure reading is high, ask your health care provider about having it checked again. TREATMENT  Treating high blood pressure includes making lifestyle changes and possibly taking medicine. Living a healthy lifestyle can help lower high blood pressure. You may need to change some of your habits. Lifestyle changes may include:  Following the DASH diet. This diet is high in fruits, vegetables, and whole grains. It is low in salt, red meat, and added sugars.  Getting at least 2 hours of brisk physical activity every week.  Losing weight if necessary.  Not smoking.  Limiting alcoholic beverages.  Learning ways to reduce stress. If lifestyle changes are not enough to get your blood pressure under control, your health care provider may prescribe medicine. You may need to take more than one. Work closely with your health care  provider to understand the risks and benefits. HOME CARE INSTRUCTIONS  Have your blood pressure rechecked as directed by your health care provider.   Take medicines only as directed by your health care provider. Follow the directions carefully. Blood pressure medicines must be taken as prescribed. The medicine does not work as well when you skip doses. Skipping doses also puts you at risk for problems.   Do not smoke.   Monitor your blood pressure at home as directed by your health care provider. SEEK MEDICAL CARE IF:   You think you are having a reaction to medicines taken.  You have recurrent headaches or feel dizzy.  You have swelling in your  ankles.  You have trouble with your vision. SEEK IMMEDIATE MEDICAL CARE IF:  You develop a severe headache or confusion.  You have unusual weakness, numbness, or feel faint.  You have severe chest or abdominal pain.  You vomit repeatedly.  You have trouble breathing. MAKE SURE YOU:   Understand these instructions.  Will watch your condition.  Will get help right away if you are not doing well or get worse. Document Released: 06/25/2005 Document Revised: 11/09/2013 Document Reviewed: 04/17/2013 [facility] Patient Information 2015 [place], [place]. This information is not intended to replace advice given to you by your health care provider. Make sure you discuss any questions you have with your health care provider.   Emergency Department Resource Guide 1) Find a Doctor and Pay Out of Pocket Although you won't have to find out who is covered by your insurance plan, it is a good idea to ask around and get recommendations. You will then need to call the office and see if the doctor you have chosen will accept you as a new patient and what types of options they offer for patients who are self-pay. Some doctors offer discounts or will set up payment plans for their patients who do not have insurance, but you will need to ask so you aren't surprised when you get to your appointment.  2) Contact Your Local Health Department Not all health departments have doctors that can see patients for sick visits, but many do, so it is worth a call to see if yours does. If you don't know where your local health department is, you can check in your phone book. The CDC also has a tool to help you locate your state's health department, and many state websites also have listings of all of their local health departments.  3) Find a Walk-in Clinic If your illness is not likely to be very severe or complicated, you may want to try a walk in clinic. These are popping up all over the country in pharmacies,  drugstores, and shopping centers. They're usually staffed by nurse practitioners or physician assistants that have been trained to treat common illnesses and complaints. They're usually fairly quick and inexpensive. However, if you have serious medical issues or chronic medical problems, these are probably not your best option.  No Primary Care Doctor: - Call Health Connect at  [phone] - they can help you locate a primary care doctor that  accepts your insurance, provides certain services, etc. - Physician Referral Service- [phone]  Chronic Pain Problems: Organization         Address  Phone   Notes  [facility]  [phone] Patients need to be referred by their primary care doctor.   Medication Assistance: Retail buyer  Notes  [facility] Med[facility] [address] [place] [place], [place] [address] [phone] --Must be a resident of [facility] -- Must have NO insurance coverage whatsoever (no Medicaid/ Medicare, etc.) -- The pt. MUST have a primary care doctor that directs their care regularly and follows them in the community   MedAssist  [phone]   [employer]  [phone]    Agencies that provide inexpensive medical care: Organization         Address  Phone   Notes  [facility]  [phone]   [facility]    [phone]   [facility] [address] [place], [place] [address] [phone]   [facility] [address] [place] [phone]   Planned Parenthood    [phone]   [facility]    [phone]   [facility]  [address], [place] Phone:  [phone], Fax:  [phone] Hours of Operation:  9 am - 6 pm, M-F.  Also accepts Medicaid/Medicare and self-pay.  [facility]  [address], [place] Phone:  [phone], Fax: [phone]. Hours of Operation:  8:30 am - 5:30 pm, M-F.  Also accepts Medicaid and self-pay.  [facility] [place] [address] [place] Phone: [phone]   [facility] [address] Natasha Bence [place], [place] [phone], Ext. [phone] Mondays & Thursdays: 7-9 AM.  First 15 patients are seen on a first come, first serve basis.    Medicaid-accepting [facility] Providers:  Organization         Address  Phone   Notes  [facility] [address], [place] [phone] Also accepts self-pay patients.  [facility] [address] Laurell Josephs [place], [place]  [phone]   [facility] [address], [place] [phone]   [facility] [address] [place] [phone]   Renaye Rakers [address], [place]   [phone] Only accepts [place] Access [place] patients after they have their name applied to their card.   Self-Pay (no insurance) in [facility]:  Organization         Address  Phone   Notes  Sickle Cell Patients, [facility] [address] [place], [place] [phone]   [facility] Care [address] [place], [place] [phone]   [facility] [place]  [address], [place] [phone]   [facility]/Dr. Osei-Bonsu  [address] [place] or [address], [place] [phone] Phone number for both [place] and [place] locations is the same.  Urgent Medical and [facility] [address], [place] [phone]   [facility] [address], [place] or [address] [phone]   [facility] [address], [place] [phone], phone; [phone], fax Sees patients 1st and 3rd Saturday of every month.  Must not qualify for public  or private insurance (i.e. Medicaid, Medicare, [place] Health Choice, Veterans' Benefits)  Household income should be no more than 200% of the poverty level The clinic cannot treat you if you are pregnant or think you are pregnant  Sexually transmitted  diseases are not treated at the clinic.    Dental Care: Organization         Address  Phone  Notes  [facility] Department of [facility] [address] [place], [place] [phone] Accepts children up to age 47 who are enrolled in [place] or [place] Health Choice; pregnant women with a Medicaid card; and children who have applied for Medicaid or [place] Health Choice, but were declined, whose parents can pay a reduced fee at time of service.  [facility] Department of [facility]  [address], [place] [phone] Accepts children up to age 38 who are enrolled in [place] or [place] Health Choice; pregnant women with a Medicaid card; and children who have applied for Medicaid or [place] Health Choice, but were declined, whose parents can pay a reduced fee at time of service.  [facility]  [address] [place], [place] [phone] Patients are seen by appointment only. Walk-ins are not accepted. [facility] will see patients 58 years of age and older. Monday - Tuesday (8am-5pm) Most Wednesdays (8:30-5pm) $30 per visit, cash only  [facility] Access [facility]  [address], [facility] [phone] Patients are seen by appointment only. Walk-ins are not accepted. [facility] will see patients 24 years of age and older. One Wednesday Evening (Monthly: Volunteer Based).  $30 per visit, cash only  [employer]  [phone] for adults; Children under age 41, call Graduate Pediatric Dentistry at [phone]. Children aged 40-14, please call [phone] to request a pediatric application.  Dental services are provided in all areas of  dental care including fillings, crowns and bridges, complete and partial dentures, implants, gum treatment, root canals, and extractions. Preventive care is also provided. Treatment is provided to both adults and children. Patients are selected via a lottery and there is often a waiting list.   [facility] [address] [place]  [phone] [email or web address]   [facility] [address] [place], [place] [phone], Ext. [phone] Second and Fourth Thursday of each month, opens at 6:30 AM; Clinic ends at 9 AM.  Patients are seen on a first-come first-served basis, and a limited number are seen during each clinic.   [facility]  [address] Ether Griffins [place], [place] [phone]   Eligibility Requirements You must have lived in [place], [place], or [place] counties for at least the last three months.   You cannot be eligible for state or federal sponsored [place], including [employer], [place], or [employer].   You generally cannot be eligible for healthcare insurance through your employer.    How to apply: Eligibility screenings are held every Tuesday and Wednesday afternoon from 1:00 pm until 4:00 pm. You do not need an appointment for the interview!  [facility] [address] [place], [place] [phone]   [facility] Health Department  [phone]   [facility] Health Department  [phone]   [facility] Health Department  [phone]    Behavioral Health Resources in the Community: Intensive Outpatient Programs Organization         Address  Phone  Notes  [facility] [address], [place], [place] [phone]   [facility] Outpatient [address], [place], [place] [phone]   [facility] [address], [place], [place]  [phone]  [facility] [address]  [place], [place] [phone] or  [phone]   Substance Abuse Resources Organization         Address  Phone  Notes  [facility]  [phone]   [facility]  [phone]   [facility]  [phone]   Floydene Flock  [phone]   Residential & Outpatient Substance Abuse Program  [phone]   Psychological Services Organization         Address  Phone  Notes  [facility] Health  [phone]   [facility] Services  [phone]   [facility] Mental Health [address] [place] [phone] or [phone]    Mobile Crisis Teams Organization         Address  Phone  Notes  [facility], Mobile Crisis Care Unit  [phone]   [facility]  [address] [place], [place] [phone]   Doristine Locks [address] [place] [phone]    Self-Help/Support Groups Organization         Address  Phone             Notes  [facility] [place] - variety of support groups  336- I7437963 Call for more information  Narcotics Anonymous (NA), [facility] [address], [employer] [place]  2 meetings at this location   Statistician         Address  Phone  Notes  [facility] [address],    [place]  [phone]   [facility]  [address] [place] [address], [place], [place] [phone]   [facility] [address] [place], [place] [phone] Admissions: 8am-3pm M-F  [facility] [address],    [place], [place] [phone]   [facility] [address] [place], [place], [place] [phone]   [facility] [address],  [place], [place] [phone]   [facility] - Intensive Outpatient [address]urell Joseph[address], [place], [place] [phone]   [facility] [address] [place]  [place], [place] [phone] or [phone]   [facility]idential  Treatment Services ([facility]) [address], [place], [place] [phone] Accepts Medicaid  [facility] [address]  [place] [phone] Substance Abuse/Addiction Treatment   [facility] Organization         Address  Phone  Notes  [facility]  4103767607   Angie Fava, PhD [address] Ervin Knack [place], [place]   [phone] or [phone]   [facility] Behavioral   [address] [place], [place] [phone]   [facility] [address] [place], [place] [phone] Insurance/Medicaid/sponsorship through [facility] and Families [address]                                    [place], [place] [phone] Therapy/tele-psych/case  [facility] [address], [place] [phone]    Dr. Lolly Mustache  [phone]   [facility] [place]  [employer] [facility] Dept. 1) [address] [place] 2) [address] [place] 3)  [address], [place] [phone]  [phone]   [facility] Child Abuse Hotline 850-881-5174 or [phone] (After Hours)

## 2015-02-15 ENCOUNTER — Encounter (HOSPITAL_COMMUNITY): Payer: Self-pay | Admitting: *Deleted

## 2015-02-15 ENCOUNTER — Emergency Department (HOSPITAL_COMMUNITY)
Admission: EM | Admit: 2015-02-15 | Discharge: 2015-02-15 | Disposition: A | Discharge status: Home / Self Care | Payer: Self-pay | Attending: Emergency Medicine | Admitting: Emergency Medicine

## 2015-02-15 DIAGNOSIS — Z791 Long term (current) use of non-steroidal anti-inflammatories (NSAID): Secondary | ICD-10-CM | POA: Insufficient documentation

## 2015-02-15 DIAGNOSIS — M5432 Sciatica, left side: Secondary | ICD-10-CM | POA: Insufficient documentation

## 2015-02-15 DIAGNOSIS — Z79899 Other long term (current) drug therapy: Secondary | ICD-10-CM | POA: Insufficient documentation

## 2015-02-15 DIAGNOSIS — I10 Essential (primary) hypertension: Secondary | ICD-10-CM | POA: Insufficient documentation

## 2015-02-15 DIAGNOSIS — E669 Obesity, unspecified: Secondary | ICD-10-CM | POA: Insufficient documentation

## 2015-02-15 MED ORDER — KETOROLAC TROMETHAMINE 30 MG/ML IJ SOLN
30.0000 mg | Freq: Once | INTRAMUSCULAR | Status: AC
  Administered 2015-02-15: 30 mg via INTRAMUSCULAR
  Filled 2015-02-15: qty 1

## 2015-02-15 MED ORDER — PREDNISONE 10 MG PO TABS: ORAL_TABLET | ORAL | Status: AC

## 2015-02-15 MED ORDER — CYCLOBENZAPRINE HCL 10 MG PO TABS: 10.0000 mg | ORAL_TABLET | Freq: Two times a day (BID) | ORAL | Status: AC | PRN

## 2015-02-15 NOTE — ED Notes (Signed)
Patient reports back pain that radiates to left leg. Was seen here for same and dx with sciata on 12/24/14.

## 2015-02-15 NOTE — ED Provider Notes (Signed)
CSN: [id number]     Arrival date & time 02/15/15  1501 History   First MD Initiated Contact with Patient 02/15/15 1515     Chief Complaint  Patient presents with  . Back Pain     (Consider location/radiation/quality/duration/timing/severity/associated sxs/prior Treatment) HPI Comments: Pt comes in with c/o left lower back pain that started a couple of days ago. She states that it feels like sciatica which she had when seen in the er in June. No fever, numbness , weakness or incontinence. She states that she has follow up in October with a doctor in [place] for the same symptoms. She hasn't tried anything.  The history is provided by the patient. No language interpreter was used.    Past Medical History  Diagnosis Date  . Obesity   . Hypertension    Past Surgical History  Procedure Laterality Date  . Carpal tunnel release      right hand  . Cesarean section     History reviewed. No pertinent family history. History  Substance Use Topics  . Smoking status: Never Smoker   . Smokeless tobacco: Not on file  . Alcohol Use: No   OB History    No data available     Review of Systems  All other systems reviewed and are negative.     Allergies  Review of patient's allergies indicates no known allergies.  Home Medications   Prior to Admission medications   Medication Sig Start Date End Date Taking? Authorizing Provider  acetaminophen (TYLENOL) 500 MG tablet Take 500 mg by mouth every 6 (six) hours as needed for mild pain or moderate pain.   Yes Historical Provider, MD  cyclobenzaprine (FLEXERIL) 10 MG tablet Take 1 tablet (10 mg total) by mouth 3 (three) times daily as needed for muscle spasms. 12/24/14  Yes Gilda Crease, MD  lisinopril-hydrochlorothiazide (PRINZIDE,ZESTORETIC) 20-12.5 MG per tablet Take 1 tablet by mouth daily. 12/24/14  Yes Gilda Crease, MD  naproxen sodium (ANAPROX) 220 MG tablet Take 440 mg by mouth 2 (two) times daily with a meal.   Yes  Historical Provider, MD  HYDROcodone-acetaminophen (NORCO/VICODIN) 5-325 MG per tablet Take 2 tablets by mouth every 4 (four) hours as needed for moderate pain. Patient not taking: Reported on 02/15/2015 12/24/14   Gilda Crease, MD  predniSONE (DELTASONE) 20 MG tablet 3 tabs po daily x 3 days, then 2 tabs x 3 days, then 1.5 tabs x 3 days, then 1 tab x 3 days, then 0.5 tabs x 3 days Patient not taking: Reported on 02/15/2015 12/24/14   Gilda Crease, MD   BP 154/103 mmHg  Pulse 62  Temp(Src) 98.7 F (37.1 C) (Oral)  Resp 16  Ht 5\' 4"  (1.626 m)  Wt 340 lb (154.223 kg)  BMI 58.33 kg/m2  SpO2 99% Physical Exam  Constitutional: She is oriented to person, place, and time. She appears well-developed and well-nourished.  Cardiovascular: Normal rate and regular rhythm.   Pulmonary/Chest: Effort normal and breath sounds normal.  Musculoskeletal: Normal range of motion.  Left sciatic notch tenderness. Good sensation and strength to bilateral lower extremities  Neurological: She is alert and oriented to person, place, and time.  Skin: Skin is warm and dry.  Psychiatric: She has a normal mood and affect.  Nursing note and vitals reviewed.   ED Course  Procedures (including critical care time) Labs Review Labs Reviewed - No data to display  Imaging Review No results found.   EKG Interpretation None  MDM   Final diagnoses:  Sciatica, left    No red flags. Will send home with flexeril and prednisone. Pt given ortho follow up    Teressa Lower, NP 02/15/15 1534  Donnetta Hutching, MD 02/17/15 1248

## 2015-02-15 NOTE — Discharge Instructions (Signed)
Sciatica Sciatica is pain, weakness, numbness, or tingling along the path of the sciatic nerve. The nerve starts in the lower back and runs down the back of each leg. The nerve controls the muscles in the lower leg and in the back of the knee, while also providing sensation to the back of the thigh, lower leg, and the sole of your foot. Sciatica is a symptom of another medical condition. For instance, nerve damage or certain conditions, such as a herniated disk or bone spur on the spine, pinch or put pressure on the sciatic nerve. This causes the pain, weakness, or other sensations normally associated with sciatica. Generally, sciatica only affects one side of the body. CAUSES   Herniated or slipped disc.  Degenerative disk disease.  A pain disorder involving the narrow muscle in the buttocks (piriformis syndrome).  Pelvic injury or fracture.  Pregnancy.  Tumor (rare). SYMPTOMS  Symptoms can vary from mild to very severe. The symptoms usually travel from the low back to the buttocks and down the back of the leg. Symptoms can include:  Mild tingling or dull aches in the lower back, leg, or hip.  Numbness in the back of the calf or sole of the foot.  Burning sensations in the lower back, leg, or hip.  Sharp pains in the lower back, leg, or hip.  Leg weakness.  Severe back pain inhibiting movement. These symptoms may get worse with coughing, sneezing, laughing, or prolonged sitting or standing. Also, being overweight may worsen symptoms. DIAGNOSIS  Your caregiver will perform a physical exam to look for common symptoms of sciatica. He or she may ask you to do certain movements or activities that would trigger sciatic nerve pain. Other tests may be performed to find the cause of the sciatica. These may include:  Blood tests.  X-rays.  Imaging tests, such as an MRI or CT scan. TREATMENT  Treatment is directed at the cause of the sciatic pain. Sometimes, treatment is not necessary  and the pain and discomfort goes away on its own. If treatment is needed, your caregiver may suggest:  Over-the-counter medicines to relieve pain.  Prescription medicines, such as anti-inflammatory medicine, muscle relaxants, or narcotics.  Applying heat or ice to the painful area.  Steroid injections to lessen pain, irritation, and inflammation around the nerve.  Reducing activity during periods of pain.  Exercising and stretching to strengthen your abdomen and improve flexibility of your spine. Your caregiver may suggest losing weight if the extra weight makes the back pain worse.  Physical therapy.  Surgery to eliminate what is pressing or pinching the nerve, such as a bone spur or part of a herniated disk. HOME CARE INSTRUCTIONS   Only take over-the-counter or prescription medicines for pain or discomfort as directed by your caregiver.  Apply ice to the affected area for 20 minutes, 3-4 times a day for the first 48-72 hours. Then try heat in the same way.  Exercise, stretch, or perform your usual activities if these do not aggravate your pain.  Attend physical therapy sessions as directed by your caregiver.  Keep all follow-up appointments as directed by your caregiver.  Do not wear high heels or shoes that do not provide proper support.  Check your mattress to see if it is too soft. A firm mattress may lessen your pain and discomfort. SEEK IMMEDIATE MEDICAL CARE IF:   You lose control of your bowel or bladder (incontinence).  You have increasing weakness in the lower back, pelvis, buttocks,   or legs.  You have redness or swelling of your back.  You have a burning sensation when you urinate.  You have pain that gets worse when you lie down or awakens you at night.  Your pain is worse than you have experienced in the past.  Your pain is lasting longer than 4 weeks.  You are suddenly losing weight without reason. MAKE SURE YOU:  Understand these  instructions.  Will watch your condition.  Will get help right away if you are not doing well or get worse. Document Released: 06/19/2001 Document Revised: 12/25/2011 Document Reviewed: 11/04/2011 [employer] Patient Information 2015 [employer]. This information is not intended to replace advice given to you by your health care provider. Make sure you discuss any questions you have with your health care provider.  

## 2015-04-19 ENCOUNTER — Emergency Department (HOSPITAL_COMMUNITY)
Admission: EM | Admit: 2015-04-19 | Discharge: 2015-04-20 | Disposition: A | Discharge status: Home / Self Care | Payer: Self-pay | Attending: Emergency Medicine | Admitting: Emergency Medicine

## 2015-04-19 ENCOUNTER — Emergency Department (HOSPITAL_COMMUNITY): Payer: Self-pay

## 2015-04-19 ENCOUNTER — Encounter (HOSPITAL_COMMUNITY): Payer: Self-pay | Admitting: Emergency Medicine

## 2015-04-19 DIAGNOSIS — Z79899 Other long term (current) drug therapy: Secondary | ICD-10-CM | POA: Insufficient documentation

## 2015-04-19 DIAGNOSIS — I10 Essential (primary) hypertension: Secondary | ICD-10-CM | POA: Insufficient documentation

## 2015-04-19 DIAGNOSIS — Z791 Long term (current) use of non-steroidal anti-inflammatories (NSAID): Secondary | ICD-10-CM | POA: Insufficient documentation

## 2015-04-19 DIAGNOSIS — M1712 Unilateral primary osteoarthritis, left knee: Secondary | ICD-10-CM | POA: Insufficient documentation

## 2015-04-19 MED ORDER — IBUPROFEN 800 MG PO TABS
800.0000 mg | ORAL_TABLET | Freq: Once | ORAL | Status: AC
  Administered 2015-04-19: 800 mg via ORAL
  Filled 2015-04-19: qty 1

## 2015-04-19 MED ORDER — ONDANSETRON 4 MG PO TBDP
4.0000 mg | ORAL_TABLET | Freq: Once | ORAL | Status: AC
  Administered 2015-04-19: 4 mg via ORAL
  Filled 2015-04-19: qty 1

## 2015-04-19 MED ORDER — TRAMADOL HCL 50 MG PO TABS
100.0000 mg | ORAL_TABLET | Freq: Once | ORAL | Status: AC
  Administered 2015-04-19: 100 mg via ORAL
  Filled 2015-04-19: qty 2

## 2015-04-19 MED ORDER — METHOCARBAMOL 500 MG PO TABS
1000.0000 mg | ORAL_TABLET | Freq: Once | ORAL | Status: AC
  Administered 2015-04-19: 1000 mg via ORAL
  Filled 2015-04-19: qty 2

## 2015-04-19 NOTE — ED Notes (Signed)
Pt c/o left leg pain and denies any injury.

## 2015-04-19 NOTE — ED Provider Notes (Signed)
CSN: [id number]     Arrival date & time 04/19/15  2301 History   First MD Initiated Contact with Patient 04/19/15 2305     Chief Complaint  Patient presents with  . Leg Pain     (Consider location/radiation/quality/duration/timing/severity/associated sxs/prior Treatment) Patient is a 44 y.o. female presenting with leg pain. The history is provided by the patient.  Leg Pain Location:  Leg Time since incident:  3 weeks Injury: no   Leg location:  L leg Pain details:    Quality:  Aching   Severity:  Severe   Onset quality:  Gradual   Duration:  3 weeks   Timing:  Intermittent   Progression:  Worsening Dislocation: no   Prior injury to area:  No Relieved by:  Nothing Worsened by:  Bearing weight and flexion Ineffective treatments:  Acetaminophen Associated symptoms: decreased ROM   Associated symptoms: no fever, no numbness and no swelling   Risk factors: no recent illness     Past Medical History  Diagnosis Date  . Obesity   . Hypertension    Past Surgical History  Procedure Laterality Date  . Carpal tunnel release      right hand  . Cesarean section     History reviewed. No pertinent family history. Social History  Substance Use Topics  . Smoking status: Never Smoker   . Smokeless tobacco: None  . Alcohol Use: No   OB History    No data available     Review of Systems  Constitutional: Negative for fever.  Musculoskeletal: Positive for arthralgias.       Leg pain  All other systems reviewed and are negative.     Allergies  Review of patient's allergies indicates no known allergies.  Home Medications   Prior to Admission medications   Medication Sig Start Date End Date Taking? Authorizing Provider  acetaminophen (TYLENOL) 500 MG tablet Take 500 mg by mouth every 6 (six) hours as needed for mild pain or moderate pain.   Yes Historical Provider, MD  cyclobenzaprine (FLEXERIL) 10 MG tablet Take 1 tablet (10 mg total) by mouth 3 (three) times daily as  needed for muscle spasms. 12/24/14  Yes Gilda Crease, MD  ibuprofen (ADVIL,MOTRIN) 800 MG tablet Take 800 mg by mouth every 8 (eight) hours as needed.   Yes Historical Provider, MD  lisinopril-hydrochlorothiazide (PRINZIDE,ZESTORETIC) 20-12.5 MG per tablet Take 1 tablet by mouth daily. 12/24/14  Yes Gilda Crease, MD  naproxen sodium (ANAPROX) 220 MG tablet Take 440 mg by mouth 2 (two) times daily with a meal.   Yes Historical Provider, MD  cyclobenzaprine (FLEXERIL) 10 MG tablet Take 1 tablet (10 mg total) by mouth 2 (two) times daily as needed for muscle spasms. 02/15/15   Teressa Lower, NP  HYDROcodone-acetaminophen (NORCO/VICODIN) 5-325 MG per tablet Take 2 tablets by mouth every 4 (four) hours as needed for moderate pain. Patient not taking: Reported on 02/15/2015 12/24/14   Gilda Crease, MD  predniSONE (DELTASONE) 10 MG tablet 6 day step down dose 02/15/15   Teressa Lower, NP  predniSONE (DELTASONE) 20 MG tablet 3 tabs po daily x 3 days, then 2 tabs x 3 days, then 1.5 tabs x 3 days, then 1 tab x 3 days, then 0.5 tabs x 3 days Patient not taking: Reported on 02/15/2015 12/24/14   Gilda Crease, MD   BP 142/90 mmHg  Pulse 100  Temp(Src) 98.2 F (36.8 C)  Resp 18  Ht  (1.626 m)  Wt 350 lb (158.759 kg)  BMI 60.05 kg/m2  SpO2 100%  LMP 04/18/2015 Physical Exam  Constitutional: She is oriented to person, place, and time. She appears well-developed and well-nourished.  Non-toxic appearance.  Morbid obesity  HENT:  Head: Normocephalic.  Right Ear: Tympanic membrane and external ear normal.  Left Ear: Tympanic membrane and external ear normal.  Eyes: EOM and lids are normal. Pupils are equal, round, and reactive to light.  Neck: Normal range of motion. Neck supple. Carotid bruit is not present.  Cardiovascular: Normal rate, regular rhythm, normal heart sounds, intact distal pulses and normal pulses.   Pulmonary/Chest: Breath sounds normal. No respiratory  distress.  Abdominal: Soft. Bowel sounds are normal. There is no tenderness. There is no guarding.  Musculoskeletal: Normal range of motion.   moderate pain from the mid lateral thigh to the anterior  tibial area of the left leg. No hot areas appreciated. No swelling noted. No posterior mass present.There is pain with flexion of the knee and hip. Negative Homans. Dorsalis Pedis Pulses 2+.  Lymphadenopathy:       Head (right side): No submandibular adenopathy present.       Head (left side): No submandibular adenopathy present.    She has no cervical adenopathy.  Neurological: She is alert and oriented to person, place, and time. She has normal strength. No cranial nerve deficit or sensory deficit.  Skin: Skin is warm and dry.  Psychiatric: She has a normal mood and affect. Her speech is normal.  Nursing note and vitals reviewed.   ED Course  Procedures (including critical care time) Labs Review Labs Reviewed - No data to display  Imaging Review No results found. I have personally reviewed and evaluated these images and lab results as part of my medical decision-making.   EKG Interpretation None      MDM  Xray of the left knee reveals advanced degenerative joint disease of the knee. No evidence for infection. No neurovascular compromise.  Pt does not meet Well's Criteria for DVT, and there is not significant redness or swelling of the left lower extremity. Suspect the pt has referred pain from the knee, or pain related to favoring the left knee. Rx for ibuprofen, robaxin, and ultram given to the patient. Pt referred to orthopedics. Pt in agreement with this plan.   Final diagnoses:  Primary osteoarthritis of left knee    *I have reviewed nursing notes, vital signs, and all appropriate lab and imaging results for this patient.[address], PA-C 04/20/15 2135  Shon Baton, MD 04/26/15 2249

## 2015-04-20 NOTE — ED Notes (Signed)
Pt states understanding of care given and follow up instructions 

## 2017-05-03 IMAGING — DX DG KNEE COMPLETE 4+V*L*
4 series · 4 of 4 positions shown · non-contrast
Comparison: None.

CLINICAL DATA: 44-year-old female with left knee pain for a month.
Initial encounter.

EXAM:
LEFT KNEE - COMPLETE 4+ VIEW

[knee ap (1 of 3)]
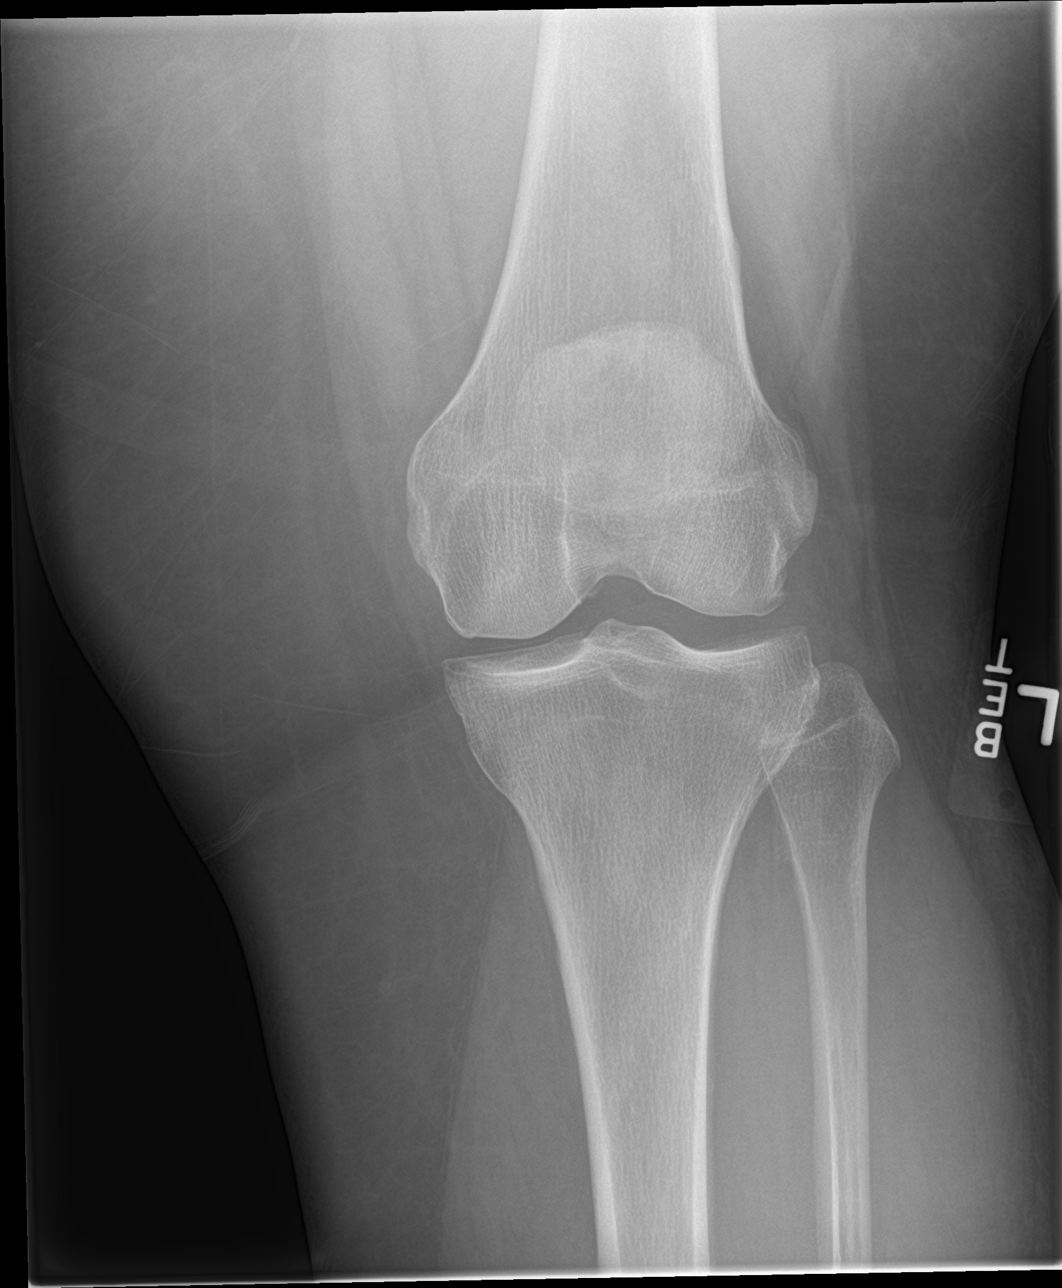

[knee lat]
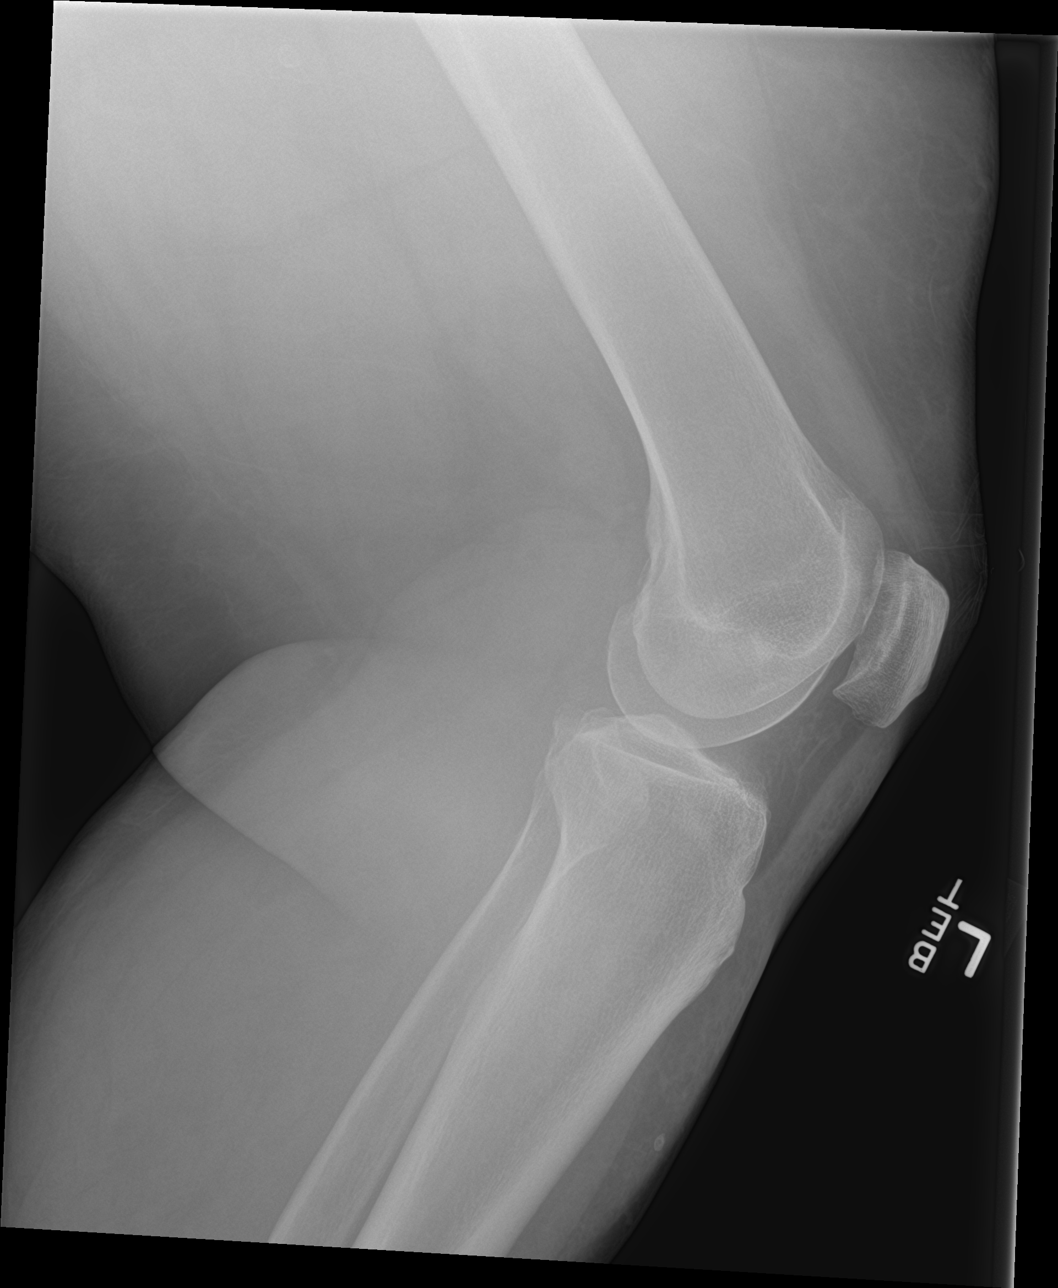

[knee ap (2 of 3)]
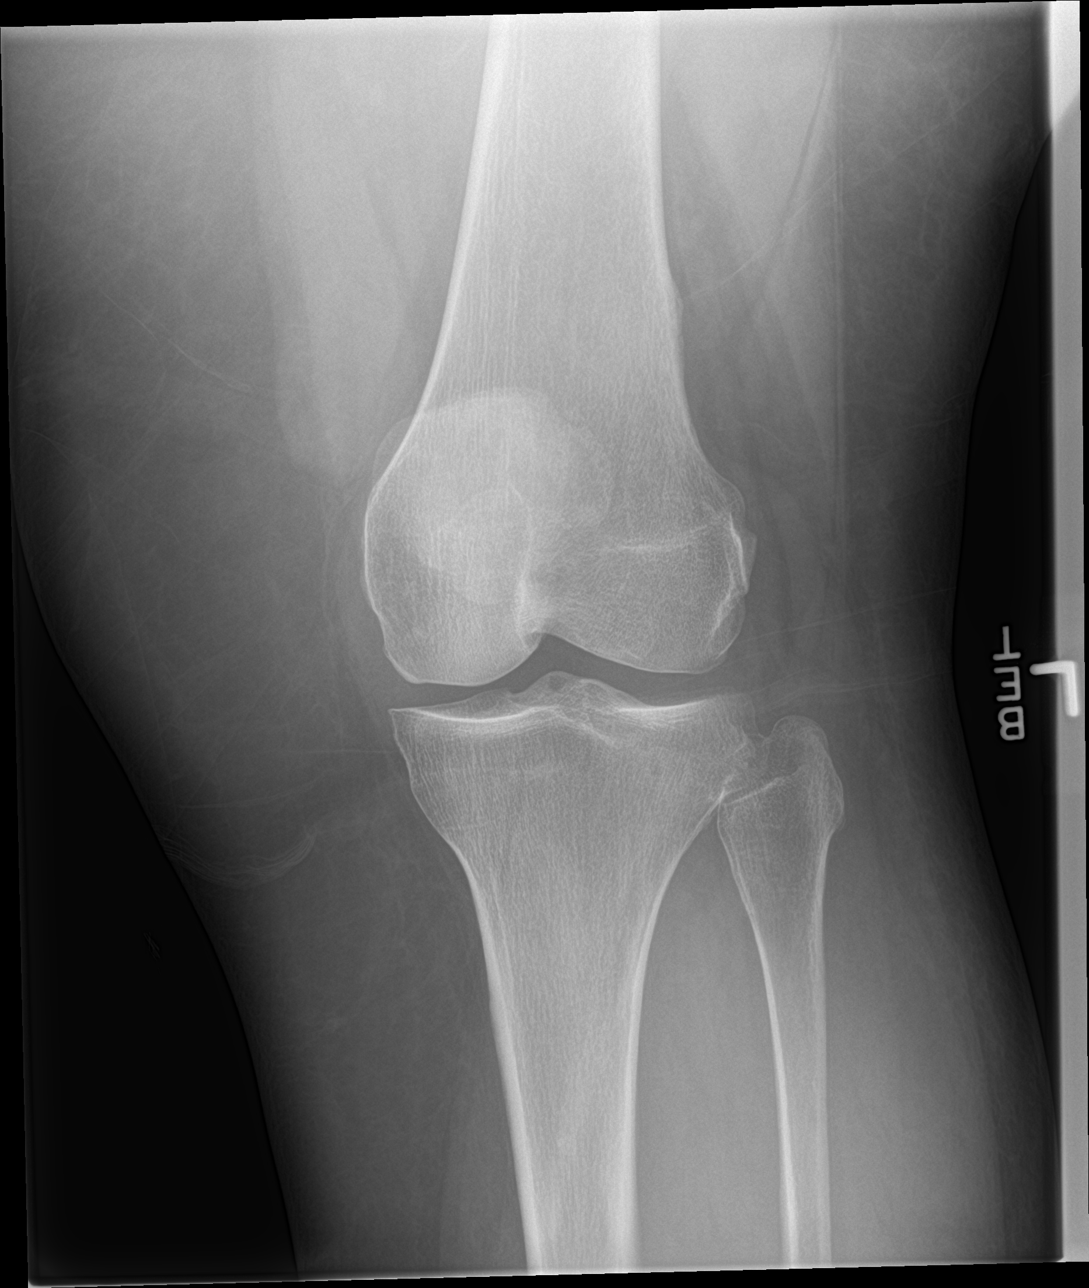

[knee ap (3 of 3)]
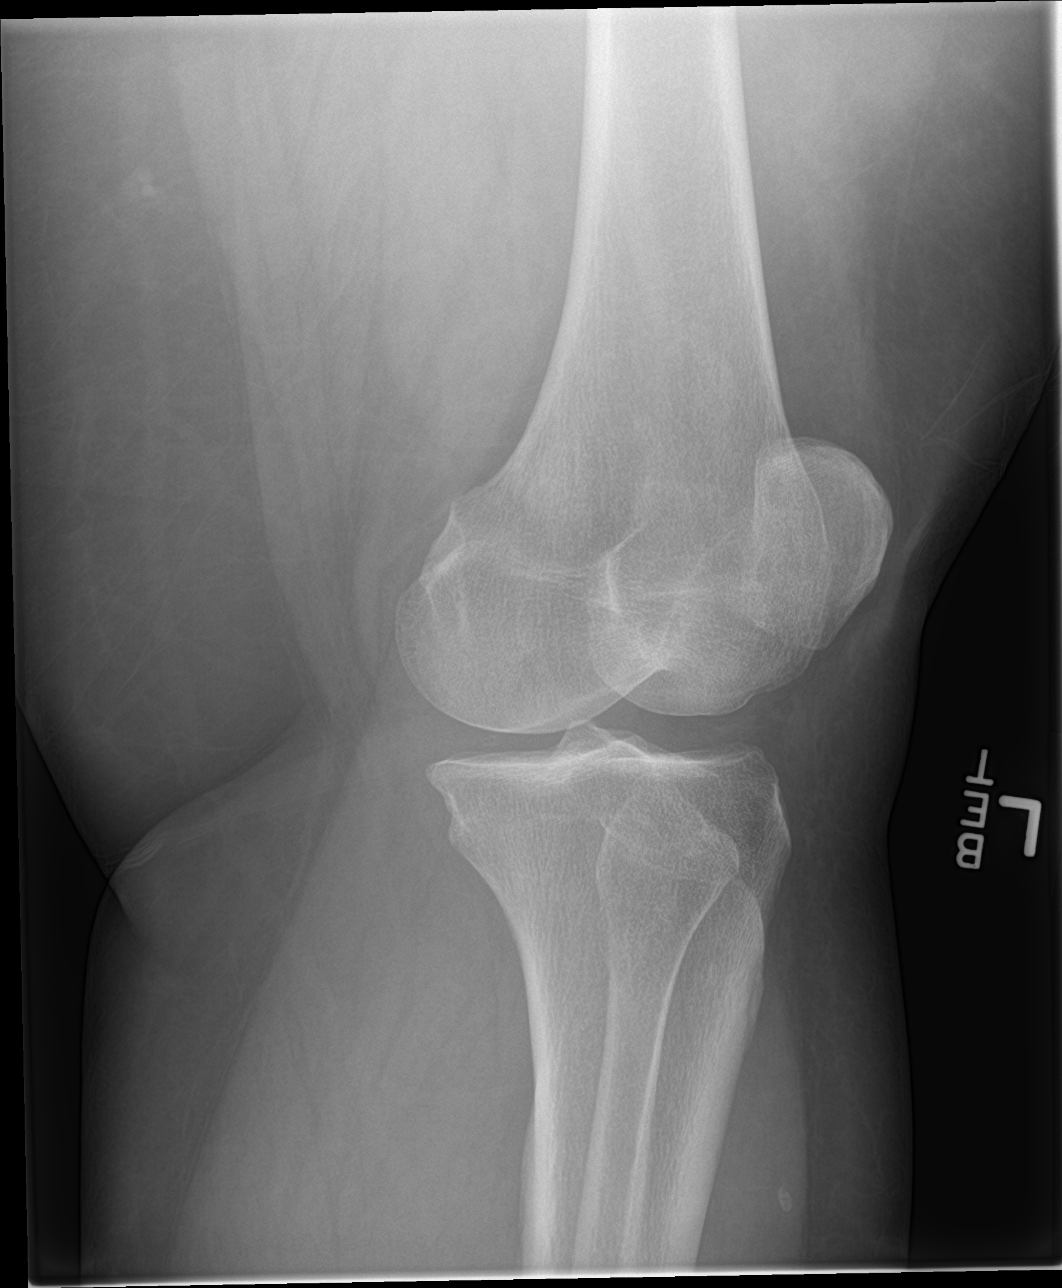

[4 of 4 positions shown; findings below may reference images not displayed]

FINDINGS: Large body habitus. Joint spaces appear preserved but there is mild
to moderate tricompartmental degenerative spurring. Patella intact.
No definite joint effusion. No acute osseous abnormality identified.
IMPRESSION: No acute osseous abnormality identified. Tricompartmental
degenerative spurring.
